# Patient Record
Sex: Female | Born: 1970 | Race: Black or African American | Hispanic: No | Marital: Single | Smoking: Current every day smoker
Health system: Southern US, Community
[De-identification: ages and names within clinical notes are randomized; demographics above are authoritative.]

## PROBLEM LIST (undated history)

## (undated) DIAGNOSIS — J45909 Unspecified asthma, uncomplicated: Secondary | ICD-10-CM

## (undated) HISTORY — PX: TUBAL LIGATION: SHX77

---

## 2018-03-15 ENCOUNTER — Other Ambulatory Visit: Payer: Self-pay

## 2018-03-15 ENCOUNTER — Encounter (HOSPITAL_COMMUNITY): Payer: Self-pay | Admitting: Emergency Medicine

## 2018-03-15 ENCOUNTER — Emergency Department (HOSPITAL_COMMUNITY)
Admission: EM | Admit: 2018-03-15 | Discharge: 2018-03-15 | Disposition: A | Discharge status: Home / Self Care | Payer: Self-pay | Attending: Emergency Medicine | Admitting: Emergency Medicine

## 2018-03-15 ENCOUNTER — Emergency Department (HOSPITAL_COMMUNITY): Payer: Self-pay

## 2018-03-15 DIAGNOSIS — R0789 Other chest pain: Secondary | ICD-10-CM | POA: Insufficient documentation

## 2018-03-15 DIAGNOSIS — F17210 Nicotine dependence, cigarettes, uncomplicated: Secondary | ICD-10-CM | POA: Insufficient documentation

## 2018-03-15 HISTORY — DX: Unspecified asthma, uncomplicated: J45.909

## 2018-03-15 LAB — BASIC METABOLIC PANEL
Anion gap: 7 (ref 5–15)
BUN: 10 mg/dL (ref 6–20)
CALCIUM: 9.3 mg/dL (ref 8.9–10.3)
CO2: 31 mmol/L (ref 22–32)
CREATININE: 0.69 mg/dL (ref 0.44–1.00)
Chloride: 103 mmol/L (ref 98–111)
GFR calc non Af Amer: >60 mL/min (ref >60)
Glucose, Bld: 123 mg/dL — ABNORMAL HIGH (ref 70–99)
Potassium: 3.3 mmol/L — ABNORMAL LOW (ref 3.5–5.1)
Sodium: 141 mmol/L (ref 135–145)

## 2018-03-15 LAB — CBC
HCT: 42.3 % (ref 36.0–46.0)
Hemoglobin: 14.2 g/dL (ref 12.0–15.0)
MCH: 30.2 pg (ref 26.0–34.0)
MCHC: 33.6 g/dL (ref 30.0–36.0)
MCV: 90 fL (ref 78.0–100.0)
PLATELETS: 244 10*3/uL (ref 150–400)
RBC: 4.7 MIL/uL (ref 3.87–5.11)
RDW: 13 % (ref 11.5–15.5)
WBC: 7.4 10*3/uL (ref 4.0–10.5)

## 2018-03-15 LAB — I-STAT BETA HCG BLOOD, ED (MC, WL, AP ONLY): I-stat hCG, quantitative: <5 m[IU]/mL (ref <5)

## 2018-03-15 LAB — DIFFERENTIAL
BASOS PCT: 0 %
Basophils Absolute: 0 10*3/uL (ref 0.0–0.1)
EOS ABS: 0.2 10*3/uL (ref 0.0–0.7)
EOS PCT: 2 %
Lymphocytes Relative: 31 %
Lymphs Abs: 2.3 10*3/uL (ref 0.7–4.0)
MONO ABS: 0.5 10*3/uL (ref 0.1–1.0)
Monocytes Relative: 7 %
Neutro Abs: 4.4 10*3/uL (ref 1.7–7.7)
Neutrophils Relative %: 60 %

## 2018-03-15 LAB — HEPATIC FUNCTION PANEL
ALK PHOS: 53 U/L (ref 38–126)
ALT: 15 U/L (ref 0–44)
AST: 17 U/L (ref 15–41)
Albumin: 3.5 g/dL (ref 3.5–5.0)
BILIRUBIN DIRECT: 0.1 mg/dL (ref 0.0–0.2)
BILIRUBIN INDIRECT: 0.8 mg/dL (ref 0.3–0.9)
Total Bilirubin: 0.9 mg/dL (ref 0.3–1.2)
Total Protein: 6.9 g/dL (ref 6.5–8.1)

## 2018-03-15 LAB — I-STAT TROPONIN, ED: TROPONIN I, POC: 0.06 ng/mL (ref 0.00–0.08)

## 2018-03-15 MED ORDER — TRAMADOL HCL 50 MG PO TABS
50.0000 mg | ORAL_TABLET | Freq: Once | ORAL | Status: AC
  Administered 2018-03-15: 50 mg via ORAL
  Filled 2018-03-15: qty 1

## 2018-03-15 MED ORDER — TRAMADOL HCL 50 MG PO TABS
50.0000 mg | ORAL_TABLET | Freq: Four times a day (QID) | ORAL | 0 refills | Status: AC | PRN
Start: 2018-03-15 — End: ?

## 2018-03-15 NOTE — ED Triage Notes (Signed)
Pt chest pains for 2 days. Worse with movement and breathing. Denies cough.

## 2018-03-15 NOTE — Discharge Instructions (Addendum)
Follow-up with a family doctor next week for recheck no heavy lifting

## 2018-03-15 NOTE — ED Provider Notes (Signed)
[facility]-EMERGENCY DEPT Provider Note   CSN: [id number] Arrival date & time: 03/15/18  1247     History   Chief Complaint Chief Complaint  Patient presents with  . Chest Pain    HPI Caroline Sanders is a 47 y.o. female.  Patient complains of left upper chest and left arm discomfort.  Patient was seen by the chiropractor today and his blood pressure was elevated so he returned to the emergency department.  Pain does not seem to be exertional  The history is provided by the patient. No language interpreter was used.  Chest Pain   This is a new problem. The current episode started 2 days ago. The problem occurs hourly. The problem has not changed since onset.The pain is associated with movement. The pain is present in the lateral region. The pain is at a severity of 4/10. The pain is moderate. The quality of the pain is described as stabbing. Radiates to: Left arm. Pertinent negatives include no abdominal pain, no back pain, no cough and no headaches.  Pertinent negatives for past medical history include no seizures.    Past Medical History:  Diagnosis Date  . Asthma     There are no active problems to display for this patient.   Past Surgical History:  Procedure Laterality Date  . TUBAL LIGATION       OB History   None      Home Medications    Prior to Admission medications   Medication Sig Start Date End Date Taking? Authorizing Provider  acetaminophen (TYLENOL) 500 MG tablet Take 1,000 mg by mouth 3 (three) times daily as needed for moderate pain.   Yes [provider]  albuterol (PROVENTIL HFA;VENTOLIN HFA) 108 (90 Base) MCG/ACT inhaler Inhale 2 puffs into the lungs every 6 (six) hours as needed for wheezing or shortness of breath.   Yes [provider]  Aspirin-Caffeine (BC FAST PAIN RELIEF PO) Take 1 packet by mouth daily as needed (pain).   Yes [provider]  calcium carbonate (TUMS - DOSED IN MG ELEMENTAL  CALCIUM) 500 MG chewable tablet Chew 2 tablets by mouth 2 (two) times daily as needed for indigestion or heartburn.   Yes [provider]  ibuprofen (ADVIL,MOTRIN) 200 MG tablet Take 800 mg by mouth daily as needed for moderate pain.   Yes [provider]  traMADol (ULTRAM) 50 MG tablet Take 1 tablet (50 mg total) by mouth every 6 (six) hours as needed. 03/15/18   Bethann BerkshireZammit, Chelle Cayton, MD    Family History No family history on file.  Social History Social History   Tobacco Use  . Smoking status: Current Every Day Smoker    Types: Cigarettes  . Smokeless tobacco: Never Used  Substance Use Topics  . Alcohol use: Not on file  . Drug use: Not on file     Allergies   Patient has no known allergies.   Review of Systems Review of Systems  Constitutional: Negative for appetite change and fatigue.  HENT: Negative for congestion, ear discharge and sinus pressure.   Eyes: Negative for discharge.  Respiratory: Negative for cough.   Cardiovascular: Positive for chest pain.  Gastrointestinal: Negative for abdominal pain and diarrhea.  Genitourinary: Negative for frequency and hematuria.  Musculoskeletal: Negative for back pain.  Skin: Negative for rash.  Neurological: Negative for seizures and headaches.  Psychiatric/Behavioral: Negative for hallucinations.     Physical Exam Updated Vital Signs BP 122/72   Pulse 77  Temp 98.4 F (36.9 C) (Oral)   Resp 15   LMP 03/05/2018   SpO2 100%   Physical Exam  Constitutional: She is oriented to person, place, and time. She appears well-developed.  HENT:  Head: Normocephalic.  Eyes: Conjunctivae and EOM are normal. No scleral icterus.  Neck: Neck supple. No thyromegaly present.  Cardiovascular: Normal rate and regular rhythm. Exam reveals no gallop and no friction rub.  No murmur heard. Pulmonary/Chest: No stridor. She has no wheezes. She has no rales. She exhibits tenderness.  Tenderness to left upper chest  Abdominal:  She exhibits no distension. There is no tenderness. There is no rebound.  Musculoskeletal: Normal range of motion. She exhibits no edema.  Lymphadenopathy:    She has no cervical adenopathy.  Neurological: She is oriented to person, place, and time. She exhibits normal muscle tone. Coordination normal.  Skin: No rash noted. No erythema.  Psychiatric: She has a normal mood and affect. Her behavior is normal.     ED Treatments / Results  Labs (all labs ordered are listed, but only abnormal results are displayed) Labs Reviewed  BASIC METABOLIC PANEL - Abnormal; Notable for the following components:      Result Value   Potassium 3.3 (*)    Glucose, Bld 123 (*)    All other components within normal limits  CBC  DIFFERENTIAL  HEPATIC FUNCTION PANEL  I-STAT TROPONIN, ED  I-STAT BETA HCG BLOOD, ED ([facility], [facility], [facility] ONLY)    EKG EKG Interpretation  Date/Time:  Thursday March 15 2018 12:58:23 EDT Ventricular Rate:  88 PR Interval:    QRS Duration: 90 QT Interval:  365 QTC Calculation: 442 R Axis:   -15 Text Interpretation:  Sinus rhythm Borderline left axis deviation Low voltage, precordial leads Baseline wander in lead(s) V6 Confirmed by Bethann Berkshire [phone]) on 03/15/2018 4:11:34 PM   Radiology Dg Chest 2 View  Result Date: 03/15/2018 CLINICAL DATA:  Chest pain. EXAM: CHEST - 2 VIEW COMPARISON:  No recent prior. FINDINGS: Mediastinum and hilar structures normal. Heart size normal. Mild bilateral interstitial prominence. These changes may be chronic. Mild interstitial process including pneumonitis cannot be excluded. No pleural effusion or pneumothorax. No acute bony abnormality. IMPRESSION: Mild bilateral interstitial prominence noted. These changes may be chronic. Mild interstitial prominence such as pneumonitis cannot be excluded. Electronically Signed   By: Maisie Fus  Register   On: 03/15/2018 13:47    Procedures Procedures (including critical care time)  Medications Ordered in  ED Medications  traMADol (ULTRAM) tablet 50 mg (has no administration in time range)     Initial Impression / Assessment and Plan / ED Course  I have reviewed the triage vital signs and the nursing notes.  Pertinent labs & imaging results that were available during my care of the patient were reviewed by me and considered in my medical decision making (see chart for details).     CBC chemistries troponin unremarkable.  EKG shows some mild nonspecific ST changes.  Patient is getting a chest x-ray and will be reevaluated by Dr. Lynelle Doctor  Final Clinical Impressions(s) / ED Diagnoses   Final diagnoses:  Atypical chest pain    ED Discharge Orders        Ordered    traMADol (ULTRAM) 50 MG tablet  Every 6 hours PRN     03/15/18 1622       Bethann Berkshire, MD 03/15/18 [phone]

## 2019-06-14 ENCOUNTER — Emergency Department (HOSPITAL_COMMUNITY)
Admission: EM | Admit: 2019-06-14 | Discharge: 2019-06-14 | Disposition: A | Discharge status: Home / Self Care | Payer: Self-pay | Attending: Emergency Medicine | Admitting: Emergency Medicine

## 2019-06-14 ENCOUNTER — Other Ambulatory Visit: Payer: Self-pay

## 2019-06-14 DIAGNOSIS — R05 Cough: Secondary | ICD-10-CM | POA: Insufficient documentation

## 2019-06-14 DIAGNOSIS — Z20828 Contact with and (suspected) exposure to other viral communicable diseases: Secondary | ICD-10-CM | POA: Insufficient documentation

## 2019-06-14 DIAGNOSIS — R059 Cough, unspecified: Secondary | ICD-10-CM

## 2019-06-14 DIAGNOSIS — F17210 Nicotine dependence, cigarettes, uncomplicated: Secondary | ICD-10-CM | POA: Insufficient documentation

## 2019-06-14 DIAGNOSIS — J45909 Unspecified asthma, uncomplicated: Secondary | ICD-10-CM | POA: Insufficient documentation

## 2019-06-14 LAB — SARS CORONAVIRUS 2 (TAT 6-24 HRS): SARS Coronavirus 2: NEGATIVE

## 2019-06-14 MED ORDER — ZINC GLUCONATE 50 MG PO TABS: 50.0000 mg | ORAL_TABLET | Freq: Every day | ORAL | 0 refills | Status: AC

## 2019-06-14 NOTE — ED Notes (Signed)
Pt ambulatory in triage. 

## 2019-06-14 NOTE — ED Provider Notes (Signed)
[facility] DEPT Provider Note   CSN: [id number] Arrival date & time: 06/14/19  3818     History   Chief Complaint Chief Complaint  Patient presents with  . Covid Exposure    HPI Caroline Sanders is a 48 y.o. female     HPI Patient is 48 year old female with a history of asthma presenting today for cough, body aches, headache and chest pain that began 2 hours prior to arrival.  Patient states that she was exposed to Covid yesterday as her roommates significant other has tested positive and was over at the house.  Patient denies any change in taste or smell sensations.  Describes chest pain as an irritation when she coughs.  States she has history of seasonal allergies and has her typical congestion.   Denies difficulty breathing, sternal chest pain, exertional dyspnea, fevers.  Denies pleuritic chest pain, leg swelling, leg pain, palpitations. Denies abd pain, NV or changes/issues with BM or urination.     Past Medical History:  Diagnosis Date  . Asthma     There are no active problems to display for this patient.   Past Surgical History:  Procedure Laterality Date  . TUBAL LIGATION       OB History   No obstetric history on file.      Home Medications    Prior to Admission medications   Medication Sig Start Date End Date Taking? Authorizing Provider  acetaminophen (TYLENOL) 500 MG tablet Take 1,000 mg by mouth 3 (three) times daily as needed for moderate pain.    [provider]  albuterol (PROVENTIL HFA;VENTOLIN HFA) 108 (90 Base) MCG/ACT inhaler Inhale 2 puffs into the lungs every 6 (six) hours as needed for wheezing or shortness of breath.    [provider]  Aspirin-Caffeine (BC FAST PAIN RELIEF PO) Take 1 packet by mouth daily as needed (pain).    [provider]  calcium carbonate (TUMS - DOSED IN MG ELEMENTAL CALCIUM) 500 MG chewable tablet Chew 2 tablets by mouth 2 (two) times daily as needed for  indigestion or heartburn.    [provider]  ibuprofen (ADVIL,MOTRIN) 200 MG tablet Take 800 mg by mouth daily as needed for moderate pain.    [provider]  traMADol (ULTRAM) 50 MG tablet Take 1 tablet (50 mg total) by mouth every 6 (six) hours as needed. 03/15/18   Milton Ferguson, MD  zinc gluconate 50 MG tablet Take 1 tablet (50 mg total) by mouth daily for 14 days. 06/14/19 06/28/19  Tedd Sias, PA    Family History No family history on file.  Social History Social History   Tobacco Use  . Smoking status: Current Every Day Smoker    Types: Cigarettes  . Smokeless tobacco: Never Used  Substance Use Topics  . Alcohol use: Not on file  . Drug use: Not on file     Allergies   Patient has no known allergies.   Review of Systems Review of Systems  Constitutional: Negative for fever.  HENT: Positive for congestion and sore throat.   Respiratory: Positive for chest tightness. Negative for shortness of breath.   Cardiovascular: Negative for leg swelling.  Gastrointestinal: Negative for nausea and vomiting.  Genitourinary: Negative for dysuria.  Musculoskeletal: Positive for myalgias.  Neurological: Positive for headaches.     Physical Exam Updated Vital Signs BP (!) 155/83 (BP Location: Left Arm)   Pulse 78   Temp 99.2 F (37.3 C) (Oral)   Resp  17   Ht 5\' 4"  (1.626 m)   Wt 60.8 kg   SpO2 100%   BMI 23.00 kg/m   Physical Exam Vitals signs and nursing note reviewed.  Constitutional:      General: She is not in acute distress.    Appearance: She is not ill-appearing.  HENT:     Head: Normocephalic and atraumatic.     Nose: Nose normal.     Mouth/Throat:     Mouth: Mucous membranes are moist.  Eyes:     General: No scleral icterus. Neck:     Musculoskeletal: Normal range of motion.  Cardiovascular:     Rate and Rhythm: Normal rate and regular rhythm.     Pulses: Normal pulses.     Heart sounds: Normal heart sounds.  Pulmonary:      Effort: Pulmonary effort is normal. No respiratory distress.     Breath sounds: Normal breath sounds. No wheezing.     Comments: Patient with normal respiratory effort.  Speaking in full sentences.  Patient with mild cough nonproductive. Abdominal:     Palpations: Abdomen is soft.     Tenderness: There is no abdominal tenderness.  Musculoskeletal:     Right lower leg: No edema.     Left lower leg: No edema.  Skin:    General: Skin is warm and dry.     Capillary Refill: Capillary refill takes less than 2 seconds.  Neurological:     Mental Status: She is alert. Mental status is at baseline.  Psychiatric:        Mood and Affect: Mood normal.        Behavior: Behavior normal.      ED Treatments / Results  Labs (all labs ordered are listed, but only abnormal results are displayed) Labs Reviewed  SARS CORONAVIRUS 2 (TAT 6-24 HRS)    EKG None  Radiology No results found.  Procedures Procedures (including critical care time)  Medications Ordered in ED Medications - No data to display   Initial Impression / Assessment and Plan / ED Course  I have reviewed the triage vital signs and the nursing notes.  Pertinent labs & imaging results that were available during my care of the patient were reviewed by me and considered in my medical decision making (see chart for details).        Patient is well-appearing 48 year old female with cough, congestion, headache, body aches since 2 hours prior to arrival.  Has been exposed to COVID recently and is requesting Covid test.  Patient has benign physical exam, with normal chest exam. Chest pain is described as more of irritation with coughing.  Patient denies any chest pressure or exertional pain.  Doubt ACS as patient has no risk factors and symptoms are explained by URI possible Covid infection. Doubt PE. Patient signed Covid isolation agreement.  Discussed 3 day wait for COVID results. Given work note for this period of time. Likely  URI if COVID test is negative. Discussed plan with patient who is agreeable to discharge with symptom control.   Caroline Glazeiffany Sanders was evaluated in Emergency Department on 06/14/2019 for the symptoms described in the history of present illness. She was evaluated in the context of the global COVID-19 pandemic, which necessitated consideration that the patient might be at risk for infection with the SARS-CoV-2 virus that causes COVID-19. Institutional protocols and algorithms that pertain to the evaluation of patients at risk for COVID-19 are in a state of rapid change based on information released  by regulatory bodies including the CDC and federal and state organizations. These policies and algorithms were followed during the patient's care in the ED.   Discharged with zinc prescription and recommendation for vitamin D over-the-counter.   Discussed case with attending physician including patient's presenting symptoms, physical exam, and planned diagnostics and interventions. Attending physician stated agreement with plan or made changes to plan which were implemented.     Final Clinical Impressions(s) / ED Diagnoses   Final diagnoses:  Cough     ED Discharge Orders         Ordered    zinc gluconate 50 MG tablet  Daily     06/14/19 0641           Gailen Shelter, PA 06/14/19 2952    Palumbo, April, MD 06/14/19 2314

## 2019-06-14 NOTE — ED Triage Notes (Signed)
Patient wants to be tested for covid because her roommate girlfriend has covid.

## 2019-06-14 NOTE — ED Notes (Signed)
Pt given discharge instructions and instructions on self quarantining at home, pt verbalized understanding and did not have any questions at this time.

## 2019-06-14 NOTE — Discharge Instructions (Signed)
Your COVID test is pending; you should expect results in 2-3 days. You can access your results on your MyChart--if you test positive you should receive a phone call.  In the meantime follow CDC guidelines and quarantine, wear a mask, wash hands often.  Please take over the counter vitamin D 2000-4000 units per day. I have also prescribed zinc 50 mg per day for the next two weeks.   Please return to ED if you feel have difficulty breathing or have emergent, new or concerning symptoms.      Person Under Monitoring Name: Caroline Sanders  Location: [address] [place] [address]   Infection Prevention Recommendations for Individuals Confirmed to have, or Being Evaluated for, 2019 Novel Coronavirus (COVID-19) Infection Who Receive Care at Home  Individuals who are confirmed to have, or are being evaluated for, COVID-19 should follow the prevention steps below until a healthcare provider or local or state health department says they can return to normal activities.  Stay home except to get medical care You should restrict activities outside your home, except for getting medical care. Do not go to work, school, or public areas, and do not use public transportation or taxis.  Call ahead before visiting your doctor Before your medical appointment, call the healthcare provider and tell them that you have, or are being evaluated for, COVID-19 infection. This will help the healthcare providers office take steps to keep other people from getting infected. Ask your healthcare provider to call the local or state health department.  Monitor your symptoms Seek prompt medical attention if your illness is worsening (e.g., difficulty breathing). Before going to your medical appointment, call the healthcare provider and tell them that you have, or are being evaluated for, COVID-19 infection. Ask your healthcare provider to call the local or state health department.  Wear a facemask You  should wear a facemask that covers your nose and mouth when you are in the same room with other people and when you visit a healthcare provider. People who live with or visit you should also wear a facemask while they are in the same room with you.  Separate yourself from other people in your home As much as possible, you should stay in a different room from other people in your home. Also, you should use a separate bathroom, if available.  Avoid sharing household items You should not share dishes, drinking glasses, cups, eating utensils, towels, bedding, or other items with other people in your home. After using these items, you should wash them thoroughly with soap and water.  Cover your coughs and sneezes Cover your mouth and nose with a tissue when you cough or sneeze, or you can cough or sneeze into your sleeve. Throw used tissues in a lined trash can, and immediately wash your hands with soap and water for at least 20 seconds or use an alcohol-based hand rub.  Wash your [employer] your hands often and thoroughly with soap and water for at least 20 seconds. You can use an alcohol-based hand sanitizer if soap and water are not available and if your hands are not visibly dirty. Avoid touching your eyes, nose, and mouth with unwashed hands.   Prevention Steps for Caregivers and Household Members of Individuals Confirmed to have, or Being Evaluated for, COVID-19 Infection Being Cared for in the Home  If you live with, or provide care at home for, a person confirmed to have, or being evaluated for, COVID-19 infection please follow these guidelines to  prevent infection:  Follow healthcare providers instructions Make sure that you understand and can help the patient follow any healthcare provider instructions for all care.  Provide for the patients basic needs You should help the patient with basic needs in the home and provide support for getting groceries, prescriptions, and other  personal needs.  Monitor the patients symptoms If they are getting sicker, call his or her medical provider and tell them that the patient has, or is being evaluated for, COVID-19 infection. This will help the healthcare providers office take steps to keep other people from getting infected. Ask the healthcare provider to call the local or state health department.  Limit the number of people who have contact with the patient If possible, have only one caregiver for the patient. Other household members should stay in another home or place of residence. If this is not possible, they should stay in another room, or be separated from the patient as much as possible. Use a separate bathroom, if available. Restrict visitors who do not have an essential need to be in the home.  Keep older adults, very young children, and other sick people away from the patient Keep older adults, very young children, and those who have compromised immune systems or chronic health conditions away from the patient. This includes people with chronic heart, lung, or kidney conditions, diabetes, and cancer.  Ensure good ventilation Make sure that shared spaces in the home have good air flow, such as from an air conditioner or an opened window, weather permitting.  Wash your hands often Wash your hands often and thoroughly with soap and water for at least 20 seconds. You can use an alcohol based hand sanitizer if soap and water are not available and if your hands are not visibly dirty. Avoid touching your eyes, nose, and mouth with unwashed hands. Use disposable paper towels to dry your hands. If not available, use dedicated cloth towels and replace them when they become wet.  Wear a facemask and gloves Wear a disposable facemask at all times in the room and gloves when you touch or have contact with the patients blood, body fluids, and/or secretions or excretions, such as sweat, saliva, sputum, nasal mucus, vomit,  urine, or feces.  Ensure the mask fits over your nose and mouth tightly, and do not touch it during use. Throw out disposable facemasks and gloves after using them. Do not reuse. Wash your hands immediately after removing your facemask and gloves. If your personal clothing becomes contaminated, carefully remove clothing and launder. Wash your hands after handling contaminated clothing. Place all used disposable facemasks, gloves, and other waste in a lined container before disposing them with other household waste. Remove gloves and wash your hands immediately after handling these items.  Do not share dishes, glasses, or other household items with the patient Avoid sharing household items. You should not share dishes, drinking glasses, cups, eating utensils, towels, bedding, or other items with a patient who is confirmed to have, or being evaluated for, COVID-19 infection. After the person uses these items, you should wash them thoroughly with soap and water.  Wash laundry thoroughly Immediately remove and wash clothes or bedding that have blood, body fluids, and/or secretions or excretions, such as sweat, saliva, sputum, nasal mucus, vomit, urine, or feces, on them. Wear gloves when handling laundry from the patient. Read and follow directions on labels of laundry or clothing items and detergent. In general, wash and dry with the warmest temperatures recommended on  the label.  Clean all areas the individual has used often Clean all touchable surfaces, such as counters, tabletops, doorknobs, bathroom fixtures, toilets, phones, keyboards, tablets, and bedside tables, every day. Also, clean any surfaces that may have blood, body fluids, and/or secretions or excretions on them. Wear gloves when cleaning surfaces the patient has come in contact with. Use a diluted bleach solution (e.g., dilute bleach with 1 part bleach and 10 parts water) or a household disinfectant with a label that says  EPA-registered for coronaviruses. To make a bleach solution at home, add 1 tablespoon of bleach to 1 quart (4 cups) of water. For a larger supply, add  cup of bleach to 1 gallon (16 cups) of water. Read labels of cleaning products and follow recommendations provided on product labels. Labels contain instructions for safe and effective use of the cleaning product including precautions you should take when applying the product, such as wearing gloves or eye protection and making sure you have good ventilation during use of the product. Remove gloves and wash hands immediately after cleaning.  Monitor yourself for signs and symptoms of illness Caregivers and household members are considered close contacts, should monitor their health, and will be asked to limit movement outside of the home to the extent possible. Follow the monitoring steps for close contacts listed on the symptom monitoring form.   ? If you have additional questions, contact your local health department or call the epidemiologist on call at [phone] (available 24/7). ? This guidance is subject to change. For the most up-to-date guidance from [facility], please refer to their website: YouBlogs.pl

## 2019-07-12 ENCOUNTER — Emergency Department (HOSPITAL_COMMUNITY)
Admission: EM | Admit: 2019-07-12 | Discharge: 2019-07-12 | Disposition: A | Discharge status: Home / Self Care | Payer: Self-pay | Attending: Emergency Medicine | Admitting: Emergency Medicine

## 2019-07-12 ENCOUNTER — Other Ambulatory Visit: Payer: Self-pay

## 2019-07-12 ENCOUNTER — Encounter (HOSPITAL_COMMUNITY): Payer: Self-pay

## 2019-07-12 DIAGNOSIS — J029 Acute pharyngitis, unspecified: Secondary | ICD-10-CM | POA: Insufficient documentation

## 2019-07-12 DIAGNOSIS — Z5321 Procedure and treatment not carried out due to patient leaving prior to being seen by health care provider: Secondary | ICD-10-CM | POA: Insufficient documentation

## 2019-07-12 NOTE — ED Notes (Signed)
Called in lobby X2, No answer.

## 2019-07-12 NOTE — ED Triage Notes (Signed)
Pt states that her roommate was recently dx with strep and is concerned that she now has strep as well.

## 2019-07-12 NOTE — ED Notes (Signed)
Called X1 in lobby. No answer.

## 2019-12-06 ENCOUNTER — Encounter (HOSPITAL_COMMUNITY): Payer: Self-pay | Admitting: Emergency Medicine

## 2019-12-06 ENCOUNTER — Emergency Department (HOSPITAL_COMMUNITY)
Admission: EM | Admit: 2019-12-06 | Discharge: 2019-12-06 | Disposition: A | Discharge status: Home / Self Care | Payer: Self-pay | Attending: Emergency Medicine | Admitting: Emergency Medicine

## 2019-12-06 ENCOUNTER — Other Ambulatory Visit: Payer: Self-pay

## 2019-12-06 DIAGNOSIS — M545 Low back pain, unspecified: Secondary | ICD-10-CM

## 2019-12-06 DIAGNOSIS — F17210 Nicotine dependence, cigarettes, uncomplicated: Secondary | ICD-10-CM | POA: Insufficient documentation

## 2019-12-06 MED ORDER — IBUPROFEN 800 MG PO TABS: 800.0000 mg | ORAL_TABLET | Freq: Three times a day (TID) | ORAL | 0 refills | Status: AC | PRN

## 2019-12-06 MED ORDER — CYCLOBENZAPRINE HCL 10 MG PO TABS: 10.0000 mg | ORAL_TABLET | Freq: Two times a day (BID) | ORAL | 0 refills | Status: AC | PRN

## 2019-12-06 NOTE — ED Provider Notes (Signed)
[facility] Provider Note   CSN: [id number] Arrival date & time: 12/06/19  0813     History Chief Complaint  Patient presents with  . Back Pain    Caroline Sanders is a 49 y.o. female.  The history is provided by the patient.  Back Pain Location:  Lumbar spine Quality:  Aching Radiates to:  Does not radiate Pain severity:  Mild Onset quality:  Gradual Timing:  Intermittent Progression:  Waxing and waning Chronicity:  New Context: physical stress and twisting   Relieved by:  NSAIDs Worsened by:  Movement Associated symptoms: no fever, no numbness and no weakness        Past Medical History:  Diagnosis Date  . Asthma     There are no problems to display for this patient.   Past Surgical History:  Procedure Laterality Date  . TUBAL LIGATION       OB History   No obstetric history on file.     History reviewed. No pertinent family history.  Social History   Tobacco Use  . Smoking status: Current Every Day Smoker    Packs/day: 0.50    Types: Cigarettes  . Smokeless tobacco: Never Used  Substance Use Topics  . Alcohol use: Not Currently  . Drug use: Not Currently    Home Medications Prior to Admission medications   Medication Sig Start Date End Date Taking? Authorizing Provider  acetaminophen (TYLENOL) 500 MG tablet Take 1,000 mg by mouth 3 (three) times daily as needed for moderate pain.    [provider]  albuterol (PROVENTIL HFA;VENTOLIN HFA) 108 (90 Base) MCG/ACT inhaler Inhale 2 puffs into the lungs every 6 (six) hours as needed for wheezing or shortness of breath.    [provider]  Aspirin-Caffeine (BC FAST PAIN RELIEF PO) Take 1 packet by mouth daily as needed (pain).    [provider]  calcium carbonate (TUMS - DOSED IN MG ELEMENTAL CALCIUM) 500 MG chewable tablet Chew 2 tablets by mouth 2 (two) times daily as needed for indigestion or heartburn.    [provider]    cyclobenzaprine (FLEXERIL) 10 MG tablet Take 1 tablet (10 mg total) by mouth 2 (two) times daily as needed for up to 20 doses for muscle spasms. 12/06/19   Katora Fini, DO  ibuprofen (ADVIL) 800 MG tablet Take 1 tablet (800 mg total) by mouth every 8 (eight) hours as needed for up to 30 doses. 12/06/19   Brennan Litzinger, DO  traMADol (ULTRAM) 50 MG tablet Take 1 tablet (50 mg total) by mouth every 6 (six) hours as needed. 03/15/18   Milton Ferguson, MD    Allergies    Patient has no known allergies.  Review of Systems   Review of Systems  Constitutional: Negative for fever.  Musculoskeletal: Positive for back pain. Negative for arthralgias, gait problem, joint swelling, neck pain and neck stiffness.  Skin: Negative for color change and wound.  Neurological: Negative for weakness and numbness.    Physical Exam Updated Vital Signs BP (!) 139/117 (BP Location: Left Arm)   Pulse 76   Temp 98.1 F (36.7 C) (Oral)   Resp 15   Ht 5\' 4"  (1.626 m)   Wt 61.2 kg   LMP 11/04/2019   SpO2 100%   BMI 23.17 kg/m   Physical Exam Constitutional:      General: She is not in acute distress.    Appearance: She is not ill-appearing.  Cardiovascular:  Pulses: Normal pulses.  Musculoskeletal:        General: Tenderness (low back) present. No swelling. Normal range of motion.     Cervical back: Normal range of motion. No tenderness.  Neurological:     General: No focal deficit present.     Mental Status: She is alert.     Sensory: No sensory deficit.     Motor: No weakness.     ED Results / Procedures / Treatments   Labs (all labs ordered are listed, but only abnormal results are displayed) Labs Reviewed - No data to display  EKG None  Radiology No results found.  Procedures Procedures (including critical care time)  Medications Ordered in ED Medications - No data to display  ED Course  I have reviewed the triage vital signs and the nursing notes.  Pertinent labs &  imaging results that were available during my care of the patient were reviewed by me and considered in my medical decision making (see chart for details).    MDM Rules/Calculators/A&P                      Caroline Sanders is a 49 year old female with no significant medical problems who presents to the ED with back pain.  Patient with overall unremarkable vitals.  No fever.  Patient with back spasms for the last several days.  Works on a [employer].  Denies any loss of bowel or bladder.  No weakness or numbness in the legs.  No abdominal pain.  No vaginal discharge, no dysuria, no vaginal bleeding.  Just intermittent tightness in her low back worse with movement.  Tried Motrin with minimal relief.  Will treat with Motrin, Tylenol, Flexeril.  Written off work.  Given return precautions and discharged in ED in good condition.  This chart was dictated using voice recognition software.  Despite best efforts to proofread,  errors can occur which can change the documentation meaning.   Final Clinical Impression(s) / ED Diagnoses Final diagnoses:  Acute low back pain, unspecified back pain laterality, unspecified whether sciatica present    Rx / DC Orders ED Discharge Orders         Ordered    ibuprofen (ADVIL) 800 MG tablet  Every 8 hours PRN     12/06/19 0848    cyclobenzaprine (FLEXERIL) 10 MG tablet  2 times daily PRN     12/06/19 0848           Virgina Norfolk, DO 12/06/19 4010

## 2019-12-06 NOTE — ED Triage Notes (Signed)
Patient arrived by self from home. Patient c/o bilateral back pain that has been ongoing for a week. Patient stated pain has worsened over the week.   Patient describes pain as burning.

## 2019-12-06 NOTE — Discharge Instructions (Signed)
Take 800 mg of Motrin 3 times a day for the next 5 days and then as needed.  Take 1000 mg Tylenol every 4 hours for the next 5 days and then as needed.  Take Flexeril as needed for muscle spasm.  Do not mix with alcohol or drugs as this medication can make you sleepy.  Do not use Flexeril if driving or using heavy machinery.  Consider buying over-the-counter lidocaine patches as well.

## 2020-04-14 ENCOUNTER — Emergency Department (HOSPITAL_COMMUNITY)
Admission: EM | Admit: 2020-04-14 | Discharge: 2020-04-14 | Disposition: A | Discharge status: Home / Self Care | Payer: Self-pay | Attending: Emergency Medicine | Admitting: Emergency Medicine

## 2020-04-14 ENCOUNTER — Other Ambulatory Visit: Payer: Self-pay

## 2020-04-14 ENCOUNTER — Encounter (HOSPITAL_COMMUNITY): Payer: Self-pay

## 2020-04-14 DIAGNOSIS — R410 Disorientation, unspecified: Secondary | ICD-10-CM | POA: Insufficient documentation

## 2020-04-14 DIAGNOSIS — Z5321 Procedure and treatment not carried out due to patient leaving prior to being seen by health care provider: Secondary | ICD-10-CM | POA: Insufficient documentation

## 2020-04-14 DIAGNOSIS — R42 Dizziness and giddiness: Secondary | ICD-10-CM | POA: Insufficient documentation

## 2020-04-14 LAB — URINALYSIS, ROUTINE W REFLEX MICROSCOPIC
Bacteria, UA: NONE SEEN
Glucose, UA: NEGATIVE mg/dL
Ketones, ur: NEGATIVE mg/dL
Nitrite: NEGATIVE
Protein, ur: 30 mg/dL — AB
Specific Gravity, Urine: 1.036 — ABNORMAL HIGH (ref 1.005–1.030)
pH: 5 (ref 5.0–8.0)

## 2020-04-14 LAB — BASIC METABOLIC PANEL
Anion gap: 11 (ref 5–15)
BUN: 9 mg/dL (ref 6–20)
CO2: 24 mmol/L (ref 22–32)
Calcium: 9.2 mg/dL (ref 8.9–10.3)
Chloride: 107 mmol/L (ref 98–111)
Creatinine, Ser: 0.77 mg/dL (ref 0.44–1.00)
GFR calc Af Amer: >60 mL/min (ref >60)
GFR calc non Af Amer: >60 mL/min (ref >60)
Glucose, Bld: 100 mg/dL — ABNORMAL HIGH (ref 70–99)
Potassium: 3.6 mmol/L (ref 3.5–5.1)
Sodium: 142 mmol/L (ref 135–145)

## 2020-04-14 LAB — CBC
HCT: 46.1 % — ABNORMAL HIGH (ref 36.0–46.0)
Hemoglobin: 15.7 g/dL — ABNORMAL HIGH (ref 12.0–15.0)
MCH: 30.6 pg (ref 26.0–34.0)
MCHC: 34.1 g/dL (ref 30.0–36.0)
MCV: 89.9 fL (ref 80.0–100.0)
Platelets: 285 10*3/uL (ref 150–400)
RBC: 5.13 MIL/uL — ABNORMAL HIGH (ref 3.87–5.11)
RDW: 13.4 % (ref 11.5–15.5)
WBC: 7.8 10*3/uL (ref 4.0–10.5)
nRBC: 0 % (ref 0.0–0.2)

## 2020-04-14 LAB — ACETAMINOPHEN LEVEL: Acetaminophen (Tylenol), Serum: <10 ug/mL — ABNORMAL LOW (ref 10–30)

## 2020-04-14 LAB — HEPATIC FUNCTION PANEL
ALT: 17 U/L (ref 0–44)
AST: 18 U/L (ref 15–41)
Albumin: 3.7 g/dL (ref 3.5–5.0)
Alkaline Phosphatase: 45 U/L (ref 38–126)
Bilirubin, Direct: 0.1 mg/dL (ref 0.0–0.2)
Indirect Bilirubin: 0.5 mg/dL (ref 0.3–0.9)
Total Bilirubin: 0.6 mg/dL (ref 0.3–1.2)
Total Protein: 6.9 g/dL (ref 6.5–8.1)

## 2020-04-14 LAB — ETHANOL: Alcohol, Ethyl (B): <10 mg/dL (ref <10)

## 2020-04-14 LAB — I-STAT BETA HCG BLOOD, ED (MC, WL, AP ONLY): I-stat hCG, quantitative: <5 m[IU]/mL (ref <5)

## 2020-04-14 LAB — AMMONIA: Ammonia: 36 umol/L — ABNORMAL HIGH (ref 9–35)

## 2020-04-14 LAB — SALICYLATE LEVEL: Salicylate Lvl: <7 mg/dL — ABNORMAL LOW (ref 7.0–30.0)

## 2020-04-14 MED ORDER — ACETAMINOPHEN 325 MG PO TABS
650.0000 mg | ORAL_TABLET | Freq: Once | ORAL | Status: AC | PRN
  Administered 2020-04-14: 650 mg via ORAL
  Filled 2020-04-14: qty 2

## 2020-04-14 NOTE — ED Triage Notes (Signed)
Pt presents with c/o confusion and altered mental status for the last couple of days. Pt reports that she has not been able to function, denies any drug or alcohol use. Pt reports she is very dizzy when she walks.

## 2020-04-14 NOTE — ED Notes (Signed)
Pt called from triage with no answer and not seen in lobby

## 2021-07-28 ENCOUNTER — Encounter (HOSPITAL_COMMUNITY): Payer: Self-pay

## 2021-07-28 ENCOUNTER — Emergency Department (HOSPITAL_COMMUNITY): Payer: Self-pay

## 2021-07-28 ENCOUNTER — Emergency Department (HOSPITAL_COMMUNITY)
Admission: EM | Admit: 2021-07-28 | Discharge: 2021-07-28 | Disposition: A | Discharge status: Home / Self Care | Payer: Self-pay | Attending: Emergency Medicine | Admitting: Emergency Medicine

## 2021-07-28 DIAGNOSIS — I10 Essential (primary) hypertension: Secondary | ICD-10-CM | POA: Insufficient documentation

## 2021-07-28 DIAGNOSIS — J45909 Unspecified asthma, uncomplicated: Secondary | ICD-10-CM | POA: Insufficient documentation

## 2021-07-28 DIAGNOSIS — F17210 Nicotine dependence, cigarettes, uncomplicated: Secondary | ICD-10-CM | POA: Insufficient documentation

## 2021-07-28 DIAGNOSIS — N9489 Other specified conditions associated with female genital organs and menstrual cycle: Secondary | ICD-10-CM | POA: Insufficient documentation

## 2021-07-28 LAB — CBC WITH DIFFERENTIAL/PLATELET
Abs Immature Granulocytes: 0.02 10*3/uL (ref 0.00–0.07)
Basophils Absolute: 0.1 10*3/uL (ref 0.0–0.1)
Basophils Relative: 1 %
Eosinophils Absolute: 0.8 10*3/uL — ABNORMAL HIGH (ref 0.0–0.5)
Eosinophils Relative: 8 %
HCT: 41.4 % (ref 36.0–46.0)
Hemoglobin: 13.9 g/dL (ref 12.0–15.0)
Immature Granulocytes: 0 %
Lymphocytes Relative: 29 %
Lymphs Abs: 3.1 10*3/uL (ref 0.7–4.0)
MCH: 30.5 pg (ref 26.0–34.0)
MCHC: 33.6 g/dL (ref 30.0–36.0)
MCV: 91 fL (ref 80.0–100.0)
Monocytes Absolute: 0.7 10*3/uL (ref 0.1–1.0)
Monocytes Relative: 7 %
Neutro Abs: 6 10*3/uL (ref 1.7–7.7)
Neutrophils Relative %: 55 %
Platelets: 237 10*3/uL (ref 150–400)
RBC: 4.55 MIL/uL (ref 3.87–5.11)
RDW: 13.6 % (ref 11.5–15.5)
WBC: 10.7 10*3/uL — ABNORMAL HIGH (ref 4.0–10.5)
nRBC: 0 % (ref 0.0–0.2)

## 2021-07-28 LAB — COMPREHENSIVE METABOLIC PANEL
ALT: 12 U/L (ref 0–44)
AST: 14 U/L — ABNORMAL LOW (ref 15–41)
Albumin: 3.7 g/dL (ref 3.5–5.0)
Alkaline Phosphatase: 54 U/L (ref 38–126)
Anion gap: 7 (ref 5–15)
BUN: 11 mg/dL (ref 6–20)
CO2: 26 mmol/L (ref 22–32)
Calcium: 8.4 mg/dL — ABNORMAL LOW (ref 8.9–10.3)
Chloride: 106 mmol/L (ref 98–111)
Creatinine, Ser: 0.51 mg/dL (ref 0.44–1.00)
GFR, Estimated: >60 mL/min (ref >60)
Glucose, Bld: 86 mg/dL (ref 70–99)
Potassium: 3.3 mmol/L — ABNORMAL LOW (ref 3.5–5.1)
Sodium: 139 mmol/L (ref 135–145)
Total Bilirubin: 0.3 mg/dL (ref 0.3–1.2)
Total Protein: 7 g/dL (ref 6.5–8.1)

## 2021-07-28 LAB — I-STAT BETA HCG BLOOD, ED (MC, WL, AP ONLY): I-stat hCG, quantitative: <5 m[IU]/mL (ref <5)

## 2021-07-28 MED ORDER — AMLODIPINE BESYLATE 5 MG PO TABS: 5.0000 mg | ORAL_TABLET | Freq: Once | ORAL | Status: DC

## 2021-07-28 MED ORDER — ACETAMINOPHEN 500 MG PO TABS: 1000.0000 mg | ORAL_TABLET | Freq: Once | ORAL | Status: DC

## 2021-07-28 NOTE — ED Notes (Signed)
Pt eloped. Dr. Criss Alvine aware.

## 2021-07-28 NOTE — ED Provider Notes (Signed)
[facility] DEPT Provider Note   CSN: [id number] Arrival date & time: 07/28/21  1150     History Chief Complaint  Patient presents with   Hypertension    Caroline Sanders is a 50 y.o. female.  HPI 50 year old female presents with HTN and headache for 2 weeks. 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 Checked again today and it was elevated. Has had left sided headache for 2 weeks. No vision changes or chest pain. She states that on 11/25 she had an episode of her left arm getting weak for a couple minutes.  Seem to come on all of a sudden and she had to move her left arm with her right arm.  She has full function of it.  Never had any chest pain.  She denies any significant past medical history and does not currently on any medicines.  Past Medical History:  Diagnosis Date   Asthma     There are no problems to display for this patient.   Past Surgical History:  Procedure Laterality Date   TUBAL LIGATION       OB History   No obstetric history on file.     History reviewed. No pertinent family history.  Social History   Tobacco Use   Smoking status: Every Day    Packs/day: 0.50    Types: Cigarettes   Smokeless tobacco: Never  Vaping Use   Vaping Use: Never used  Substance Use Topics   Alcohol use: Not Currently   Drug use: Not Currently    Home Medications Prior to Admission medications   Medication Sig Start Date End Date Taking? Authorizing Provider  acetaminophen (TYLENOL) 500 MG tablet Take 1,000 mg by mouth 3 (three) times daily as needed for moderate pain.    [provider]  albuterol (PROVENTIL HFA;VENTOLIN HFA) 108 (90 Base) MCG/ACT inhaler Inhale 2 puffs into the lungs every 6 (six) hours as needed for wheezing or shortness of breath.    [provider]  Aspirin-Caffeine (BC FAST PAIN RELIEF PO) Take 1 packet by mouth daily as needed (pain).    [provider]   calcium carbonate (TUMS - DOSED IN MG ELEMENTAL CALCIUM) 500 MG chewable tablet Chew 2 tablets by mouth 2 (two) times daily as needed for indigestion or heartburn.    [provider]  cyclobenzaprine (FLEXERIL) 10 MG tablet Take 1 tablet (10 mg total) by mouth 2 (two) times daily as needed for up to 20 doses for muscle spasms. 12/06/19   Curatolo, Adam, DO  ibuprofen (ADVIL) 800 MG tablet Take 1 tablet (800 mg total) by mouth every 8 (eight) hours as needed for up to 30 doses. 12/06/19   Curatolo, Adam, DO  traMADol (ULTRAM) 50 MG tablet Take 1 tablet (50 mg total) by mouth every 6 (six) hours as needed. 03/15/18   Milton Ferguson, MD    Allergies    Patient has no known allergies.  Review of Systems   Review of Systems  Eyes:  Negative for visual disturbance.  Cardiovascular:  Negative for chest pain.  Neurological:  Positive for weakness and headaches.  All other systems reviewed and are negative.  Physical Exam Updated Vital Signs BP (!) 180/90 (BP Location: Left Arm)   Pulse 97   Temp 98.1 F (36.7 C) (Oral)   Resp 18   LMP 07/24/2021 (Approximate)   SpO2 96%   Physical Exam Vitals and  nursing note reviewed.  Constitutional:      General: She is not in acute distress.    Appearance: She is well-developed. She is not ill-appearing or diaphoretic.  HENT:     Head: Normocephalic and atraumatic.     Right Ear: External ear normal.     Left Ear: External ear normal.     Nose: Nose normal.  Eyes:     General:        Right eye: No discharge.        Left eye: No discharge.     Extraocular Movements: Extraocular movements intact.     Pupils: Pupils are equal, round, and reactive to light.  Cardiovascular:     Rate and Rhythm: Normal rate and regular rhythm.     Heart sounds: Normal heart sounds.  Pulmonary:     Effort: Pulmonary effort is normal.     Breath sounds: Normal breath sounds.  Abdominal:     Palpations: Abdomen is soft.     Tenderness: There is no  abdominal tenderness.  Musculoskeletal:     Cervical back: No rigidity.  Skin:    General: Skin is warm and dry.  Neurological:     Mental Status: She is alert.     Comments: CN 3-12 grossly intact. 5/5 strength in all 4 extremities. Grossly normal sensation. Normal finger to nose.   Psychiatric:        Mood and Affect: Mood is not anxious.    ED Results / Procedures / Treatments   Labs (all labs ordered are listed, but only abnormal results are displayed) Labs Reviewed  COMPREHENSIVE METABOLIC PANEL - Abnormal; Notable for the following components:      Result Value   Potassium 3.3 (*)    Calcium 8.4 (*)    AST 14 (*)    All other components within normal limits  CBC WITH DIFFERENTIAL/PLATELET - Abnormal; Notable for the following components:   WBC 10.7 (*)    Eosinophils Absolute 0.8 (*)    All other components within normal limits  RAPID URINE DRUG SCREEN, HOSP PERFORMED  I-STAT BETA HCG BLOOD, ED ([facility], [facility], [facility] ONLY)  CBG MONITORING, ED    EKG None  Radiology CT HEAD WO CONTRAST ( )  Result Date: 07/28/2021 CLINICAL DATA:  Transient ischemic attack. Additional history provided: Patient reports high blood pressure for 2 weeks. EXAM: CT HEAD WITHOUT CONTRAST TECHNIQUE: Contiguous axial images were obtained from the base of the skull through the vertex without intravenous contrast. COMPARISON:  No pertinent prior exams available for comparison. FINDINGS: Brain: Cerebral volume is normal. There is no acute intracranial hemorrhage. No demarcated cortical infarct. No extra-axial fluid collection. No evidence of an intracranial mass. No midline shift. Vascular: No hyperdense vessel. Skull: Normal. Negative for fracture or focal lesion. Sinuses/Orbits: Visualized orbits show no acute finding. Paranasal sinus mucosal thickening. Most notably, there is moderate/severe mucosal thickening within the bilateral ethmoid sinuses, moderate mucosal thickening within the left sphenoid sinus and  at least moderate mucosal thickening within the bilateral maxillary sinuses at the imaged levels. IMPRESSION: Unremarkable non-contrast CT appearance of the brain. No evidence of acute intracranial abnormality. Paranasal sinus disease at the imaged levels, as described. Electronically Signed   By: Jackey Loge D.O.   On: 07/28/2021 14:42    Procedures Procedures   Medications Ordered in ED Medications  acetaminophen (TYLENOL) tablet 1,000 mg (has no administration in time range)  amLODipine (NORVASC) tablet 5 mg (has no administration in time range)  ED Course  I have reviewed the triage vital signs and the nursing notes.  Pertinent labs & imaging results that were available during my care of the patient were reviewed by me and considered in my medical decision making (see chart for details).    MDM Rules/Calculators/A&P                           Unfortunately, patient apparently eloped.  She had been initially screened in triage and orders sent.  I did briefly discussed with neurology who indicates that that brief of a period of some possible weakness would not really be consistent with TIA.  I was going to bring her back and started on blood pressure meds but due to boarding we have been unable to get her into a room.  She apparently eloped. Final Clinical Impression(s) / ED Diagnoses Final diagnoses:  Essential hypertension    Rx / DC Orders ED Discharge Orders     None        Sherwood Gambler, MD 07/28/21 [id number]

## 2021-07-28 NOTE — ED Triage Notes (Signed)
Pt arrived via POV, c/o high blood pressure x2 weeks. Denies any other issues. No hx of HTN

## 2022-05-25 IMAGING — CT CT HEAD W/O CM
3 of 4 series · 13 of 47 positions shown, 15 images · non-contrast
Comparison: No pertinent prior exams available for comparison.

CLINICAL DATA: Transient ischemic attack. Additional history
provided: Patient reports high blood pressure for 2 weeks.

EXAM:
CT HEAD WITHOUT CONTRAST
TECHNIQUE: Contiguous axial images were obtained from the base of the skull
through the vertex without intravenous contrast.

[Series 2: head wo · axial · 0.47mm/px · z∈[+1290,+1405]mm · 7 of 31 slices shown, 9 images]
[im 4/31  brain]
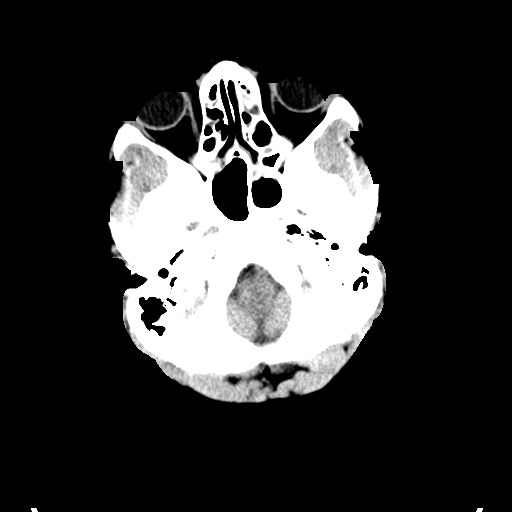
[im 4/31  bone]
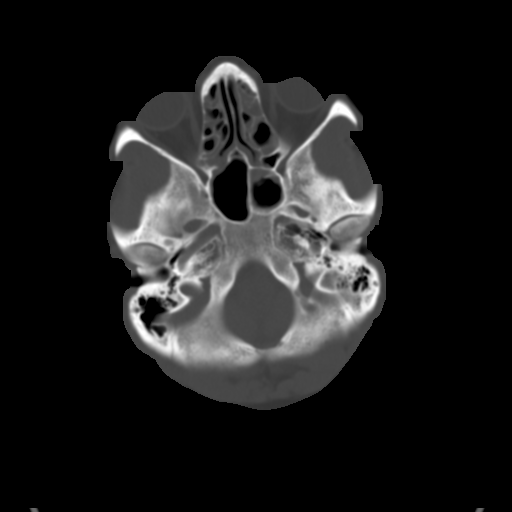
[im 8/31  brain]
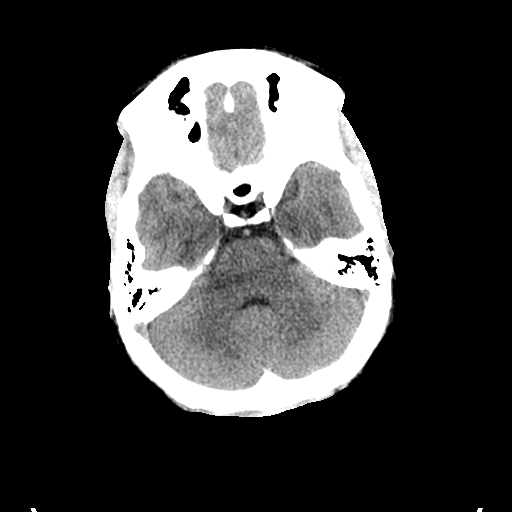
[im 12/31  brain]
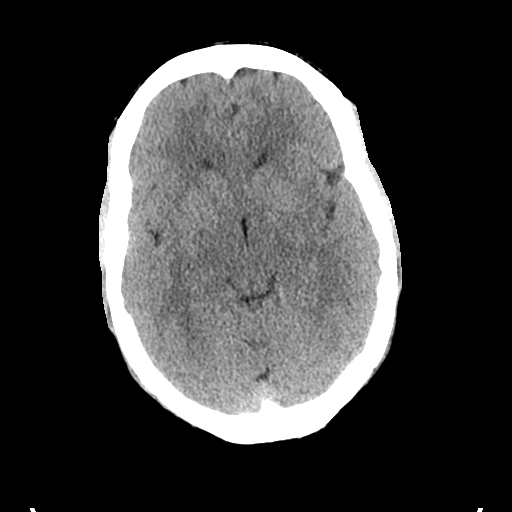
[im 16/31  brain]
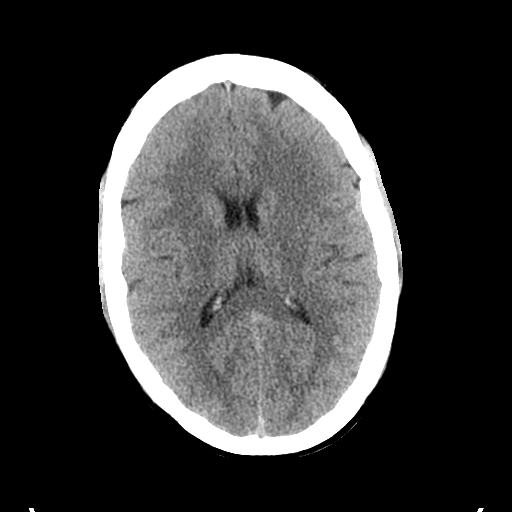
[im 19/31  brain]
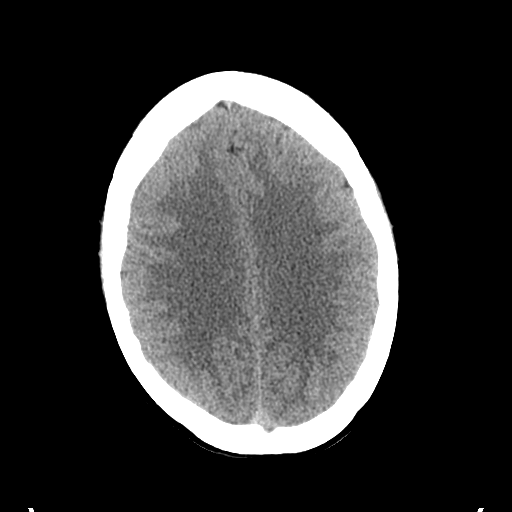
[im 19/31  bone]
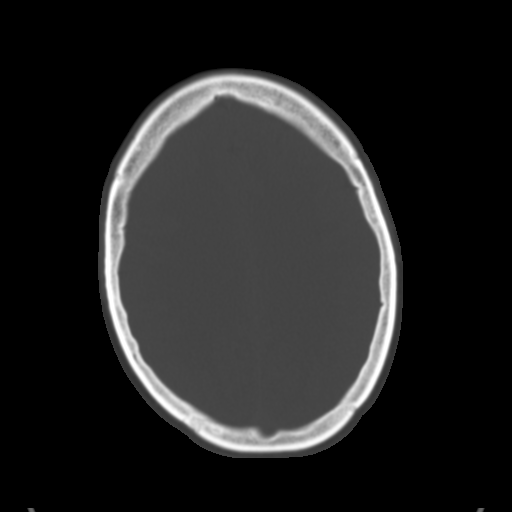
[im 23/31  brain]
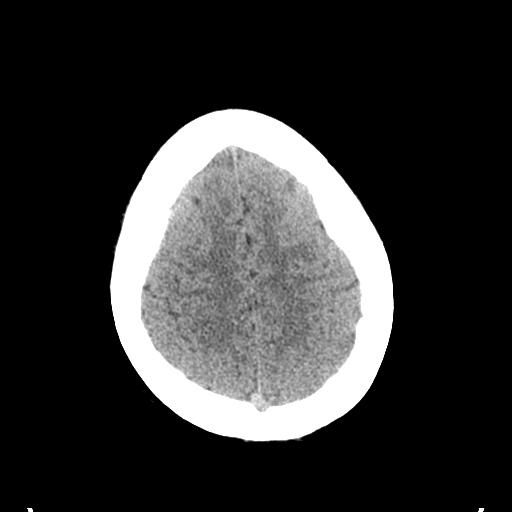
[im 27/31  brain]
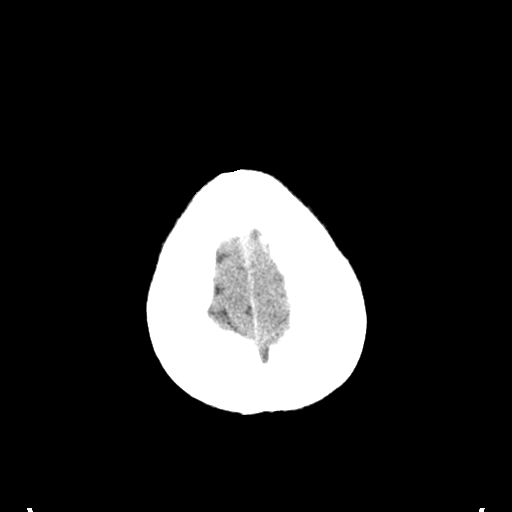

[Series 5: coronal soft tissue · coronal · 0.34mm/px · 3 of 65 slices shown]
[im 22/65  brain]
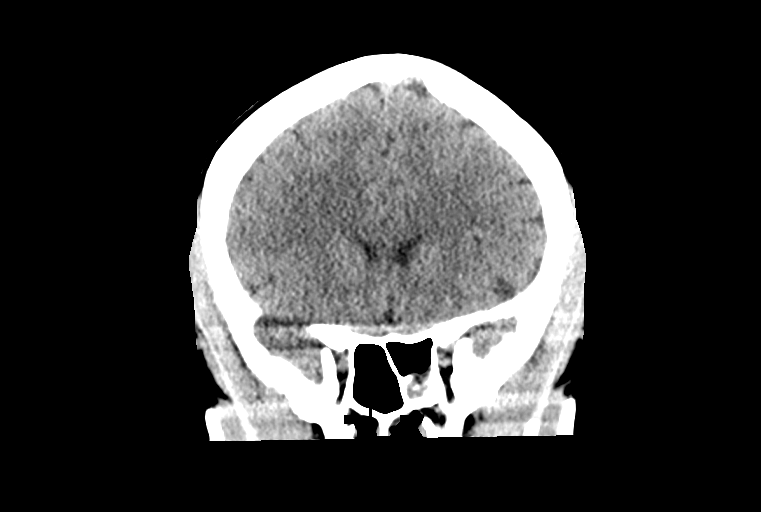
[im 29/65  brain]
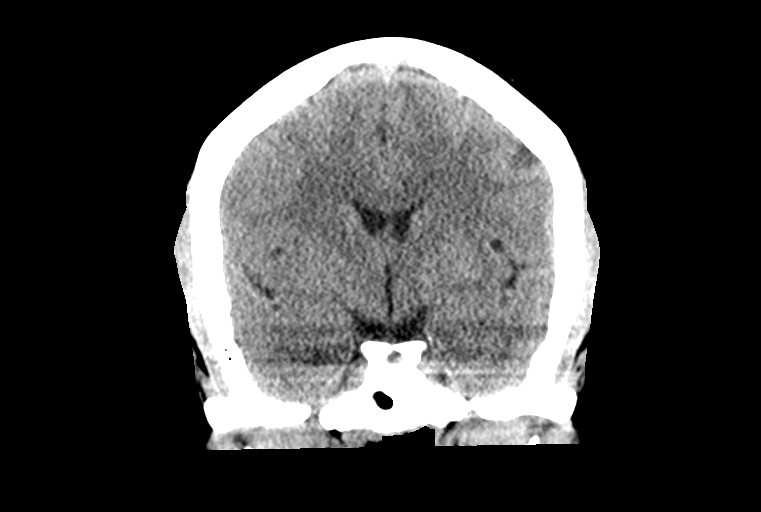
[im 36/65  brain]
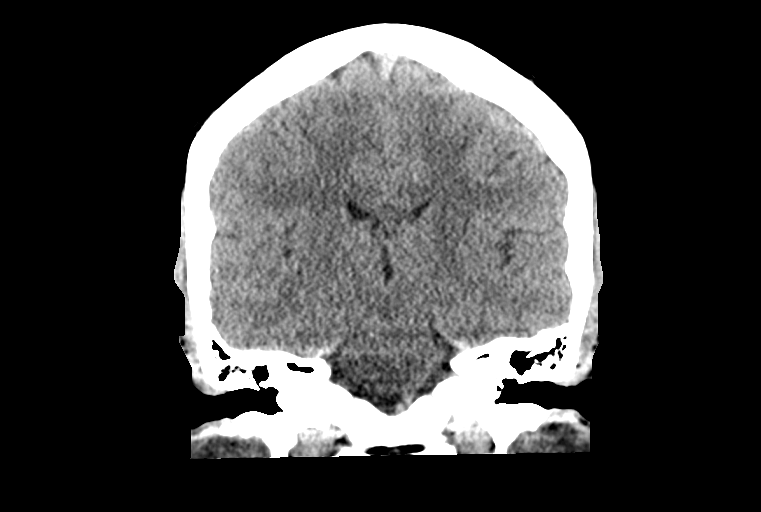

[Series 6: sagittal soft tissue · sagittal · 0.36mm/px · 3 of 47 slices shown]
[im 16/47  brain]
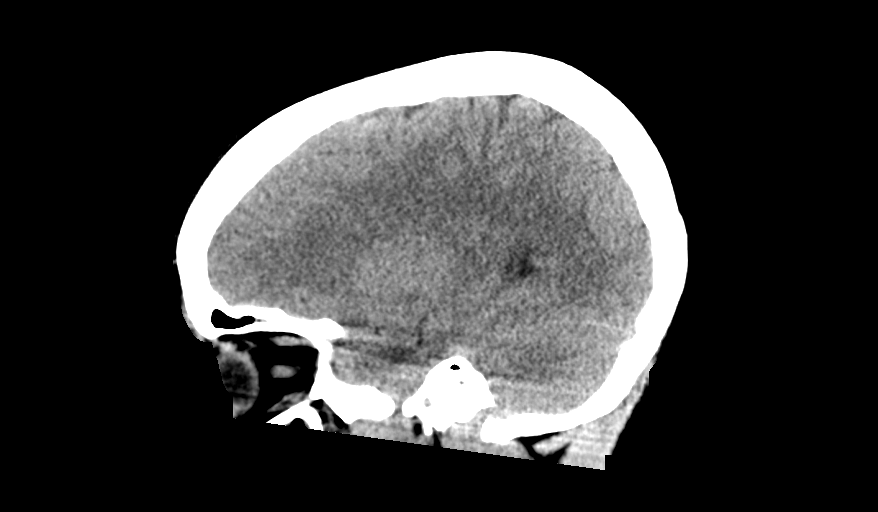
[im 24/47  brain]
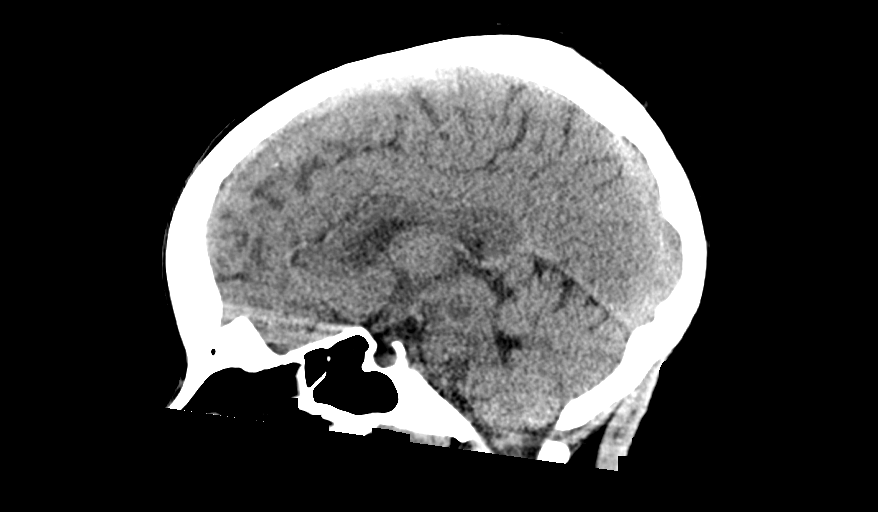
[im 31/47  brain]
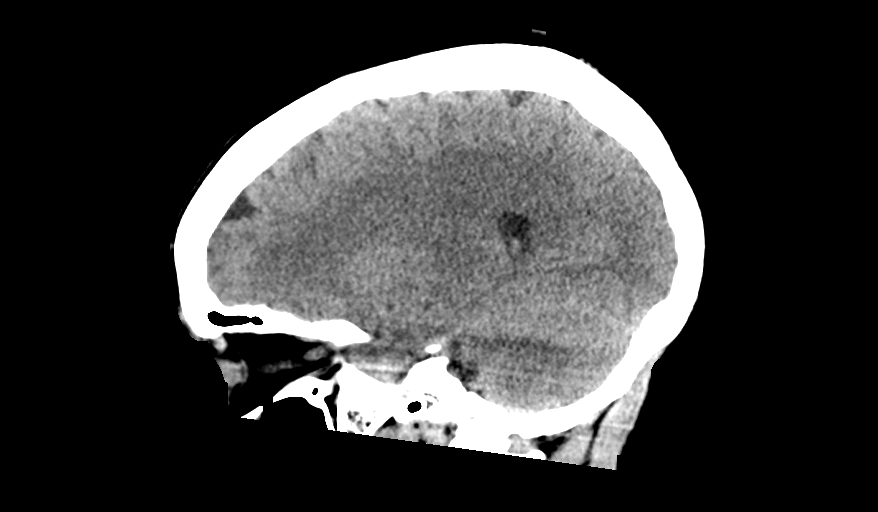

[13 of 47 positions shown; findings below may reference images not displayed]

FINDINGS: Brain:

Cerebral volume is normal.

There is no acute intracranial hemorrhage.

No demarcated cortical infarct.

No extra-axial fluid collection.

No evidence of an intracranial mass.

No midline shift.

Vascular: No hyperdense vessel.

Skull: Normal. Negative for fracture or focal lesion.

Sinuses/Orbits: Visualized orbits show no acute finding. Paranasal
sinus mucosal thickening. Most notably, there is moderate/severe
mucosal thickening within the bilateral ethmoid sinuses, moderate
mucosal thickening within the left sphenoid sinus and at least
moderate mucosal thickening within the bilateral maxillary sinuses
at the imaged levels.
IMPRESSION: Unremarkable non-contrast CT appearance of the brain. No evidence of
acute intracranial abnormality.

Paranasal sinus disease at the imaged levels, as described.
